# Patient Record
Sex: Female | Born: 1965 | Race: White | Hispanic: No | Marital: Married | Smoking: Never smoker
Health system: Southern US, Community
[De-identification: ages and names within clinical notes are randomized; demographics above are authoritative.]

## PROBLEM LIST (undated history)

## (undated) HISTORY — PX: HERNIA REPAIR: SHX51

---

## 1999-07-20 ENCOUNTER — Emergency Department (HOSPITAL_COMMUNITY): Admission: EM | Admit: 1999-07-20 | Discharge: 1999-07-20 | Payer: Self-pay | Admitting: Emergency Medicine

## 1999-07-20 ENCOUNTER — Encounter: Payer: Self-pay | Admitting: Emergency Medicine

## 2000-02-13 ENCOUNTER — Emergency Department (HOSPITAL_COMMUNITY): Admission: EM | Admit: 2000-02-13 | Discharge: 2000-02-13 | Payer: Self-pay | Admitting: *Deleted

## 2000-02-13 ENCOUNTER — Encounter: Payer: Self-pay | Admitting: *Deleted

## 2003-02-25 ENCOUNTER — Emergency Department (HOSPITAL_COMMUNITY): Admission: EM | Admit: 2003-02-25 | Discharge: 2003-02-26 | Payer: Self-pay | Admitting: Emergency Medicine

## 2003-02-26 ENCOUNTER — Encounter: Payer: Self-pay | Admitting: Emergency Medicine

## 2005-05-20 ENCOUNTER — Emergency Department (HOSPITAL_COMMUNITY): Admission: EM | Admit: 2005-05-20 | Discharge: 2005-05-20 | Payer: Self-pay | Admitting: Emergency Medicine

## 2005-06-14 ENCOUNTER — Ambulatory Visit (HOSPITAL_COMMUNITY): Admission: RE | Admit: 2005-06-14 | Discharge: 2005-06-14 | Payer: Self-pay | Admitting: Gastroenterology

## 2005-06-18 ENCOUNTER — Encounter: Admission: RE | Admit: 2005-06-18 | Discharge: 2005-06-18 | Payer: Self-pay | Admitting: Gastroenterology

## 2005-06-29 ENCOUNTER — Encounter: Admission: RE | Admit: 2005-06-29 | Discharge: 2005-06-29 | Payer: Self-pay | Admitting: Gastroenterology

## 2005-08-17 ENCOUNTER — Inpatient Hospital Stay (HOSPITAL_COMMUNITY): Admission: RE | Admit: 2005-08-17 | Discharge: 2005-08-19 | Payer: Self-pay

## 2007-01-25 IMAGING — US US ABDOMEN COMPLETE
1 series · 14 of 25 positions shown · non-contrast
Comparison: None.

CLINICAL DATA: Epigastric abdominal pain.

ABDOMEN ULTRASOUND
TECHNIQUE: Complete abdominal ultrasound examination was performed including
evaluation of the liver, gallbladder, bile ducts, pancreas, kidneys, spleen,
IVC, and abdominal aorta.

[Series 1: unknown · 0.34mm/px · 14 of 66 slices shown]
[im 1/66]
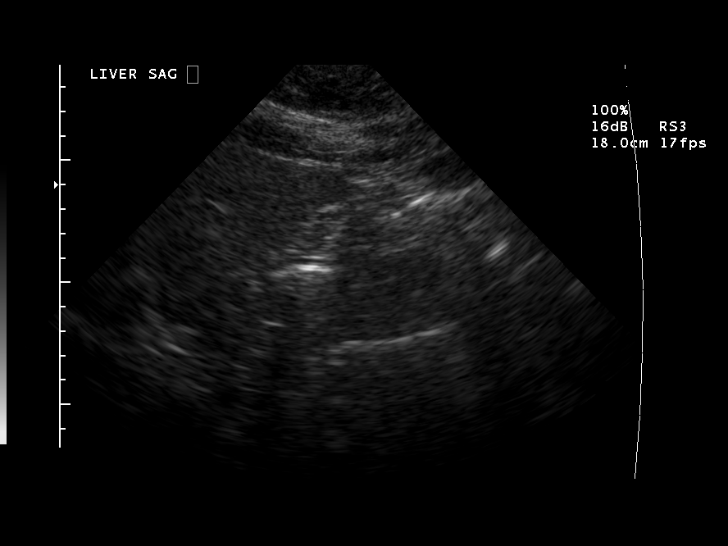
[im 6/66]
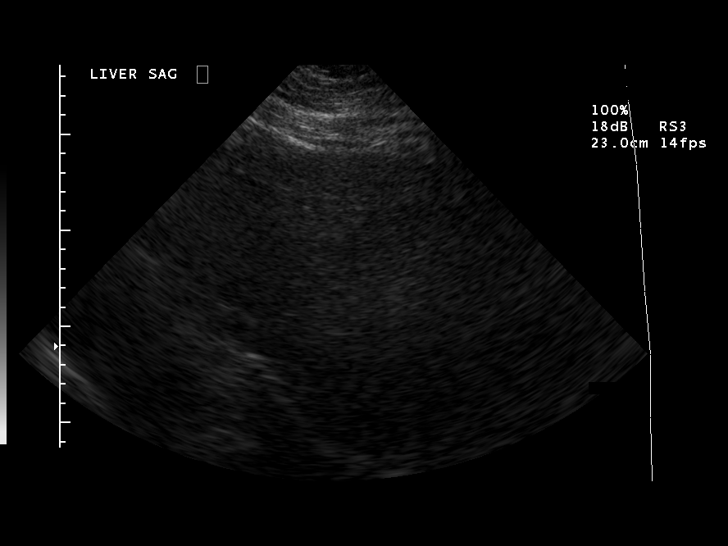
[im 11/66]
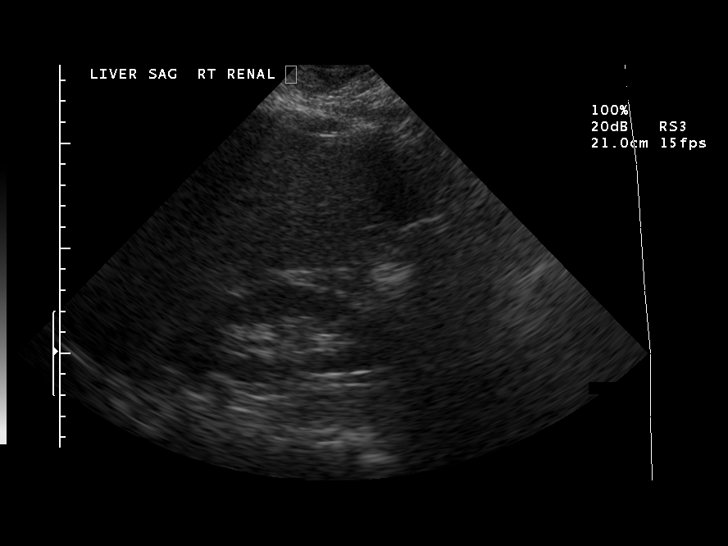
[im 17/66]
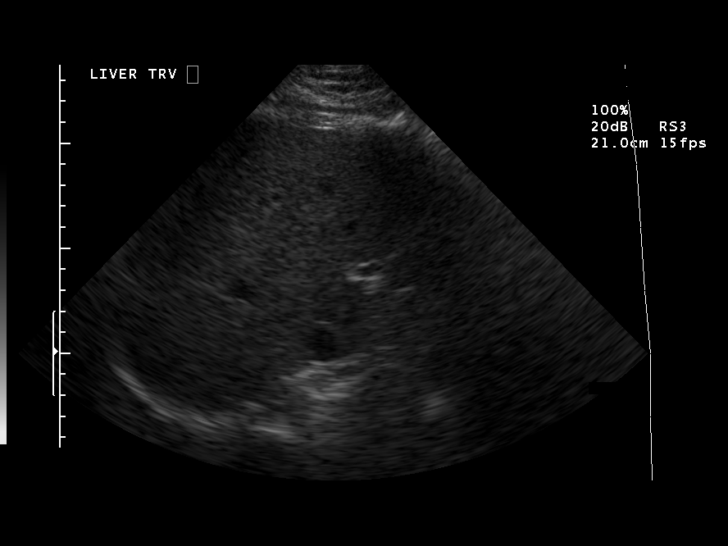
[im 22/66]
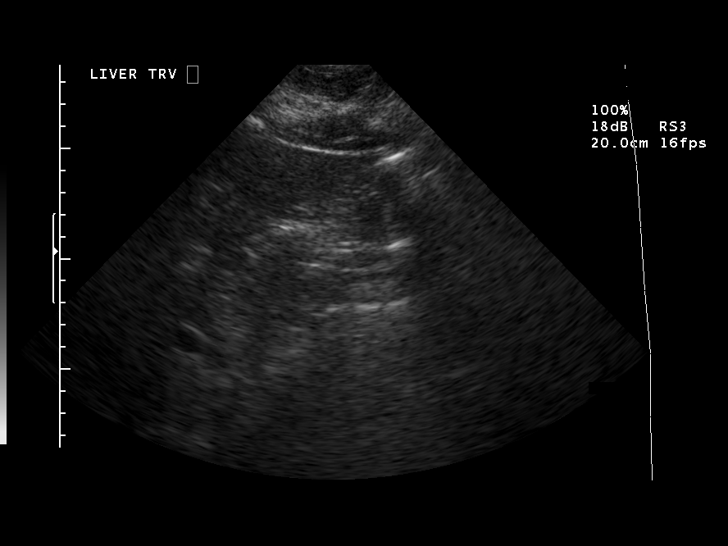
[im 25/66]
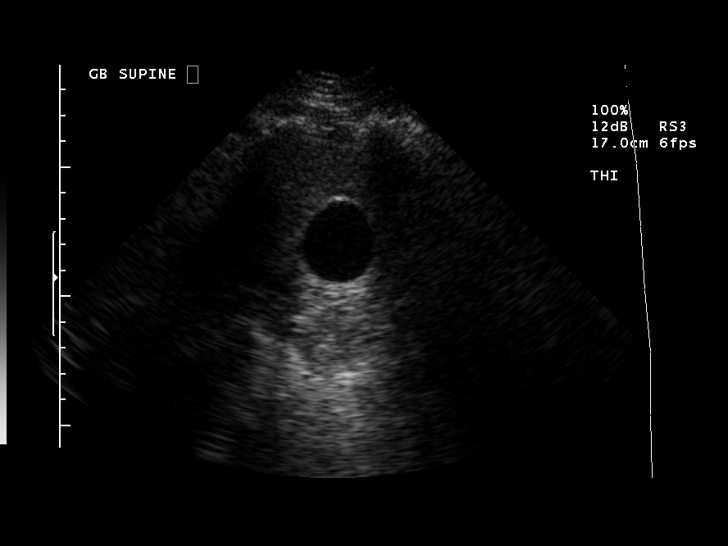
[im 30/66]
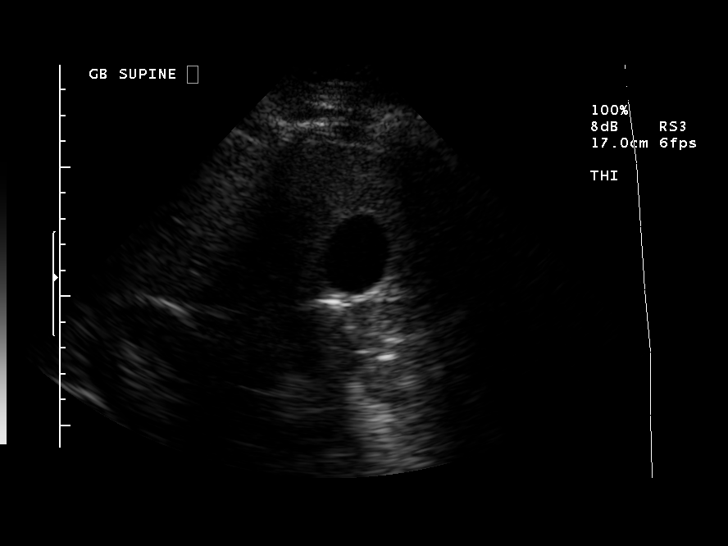
[im 36/66]
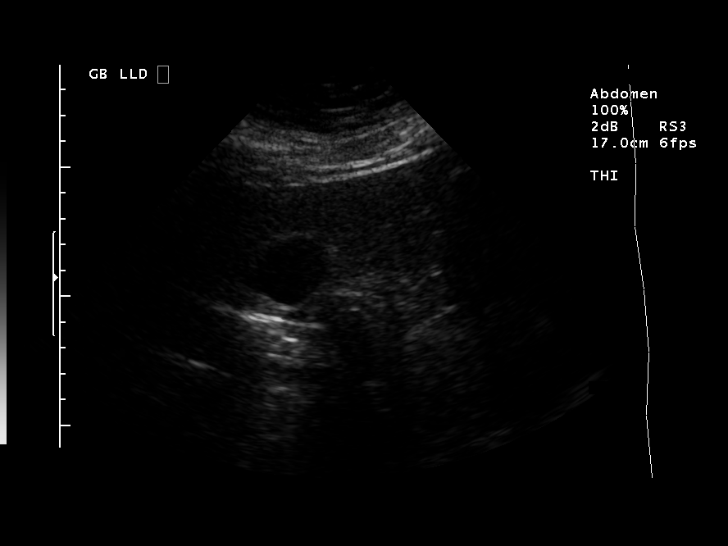
[im 41/66]
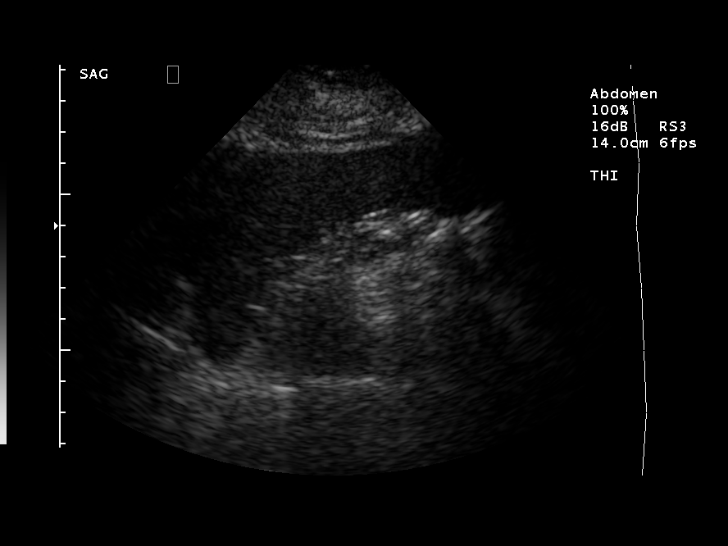
[im 44/66]
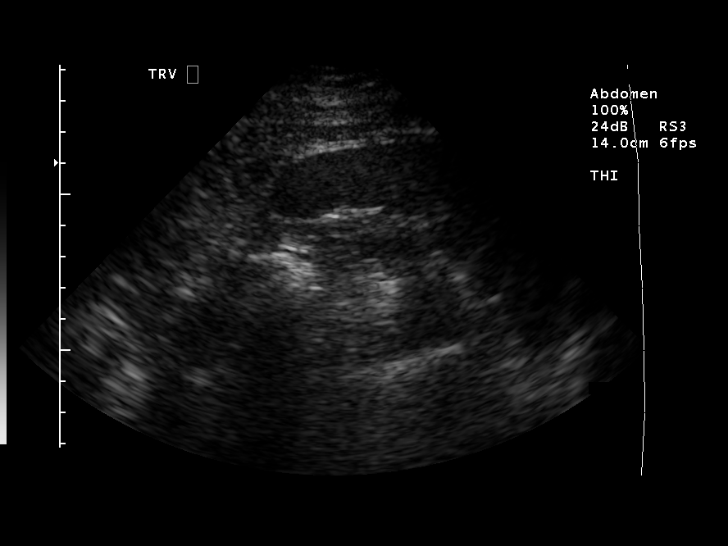
[im 49/66]
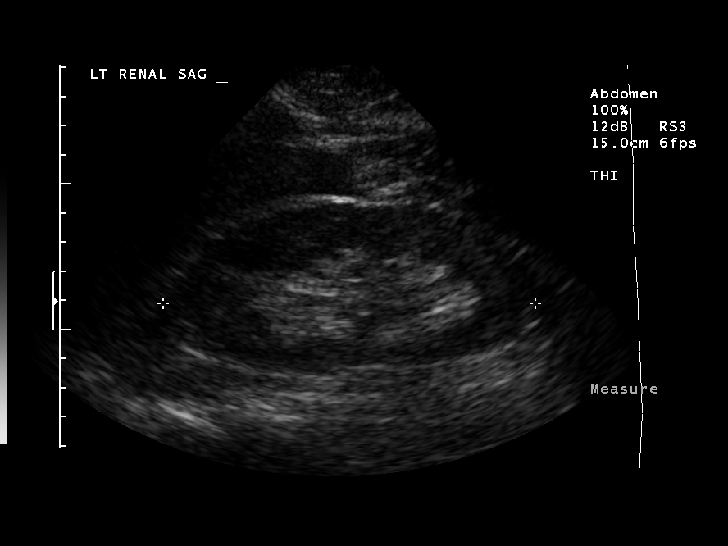
[im 55/66]
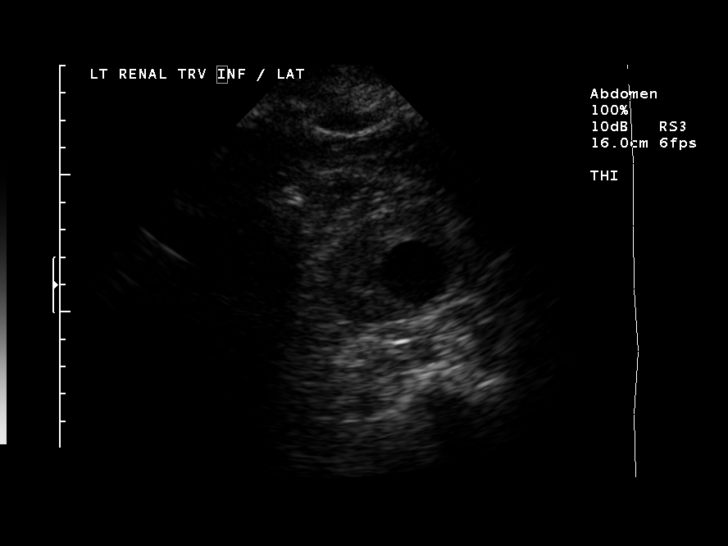
[im 60/66]
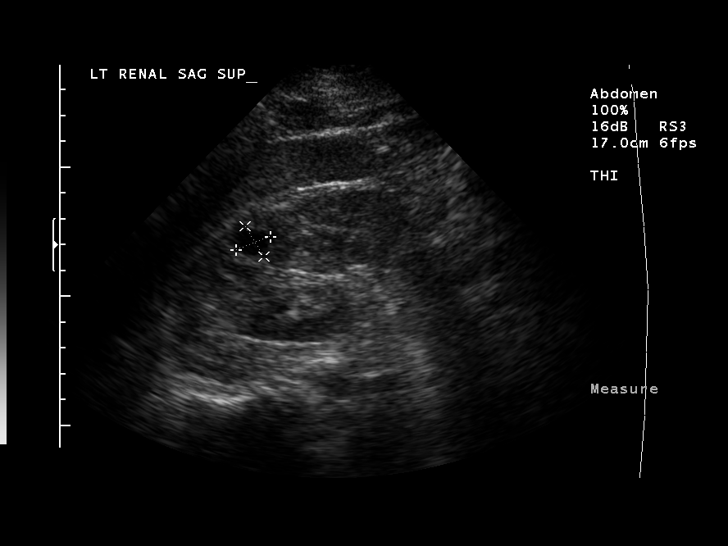
[im 66/66]
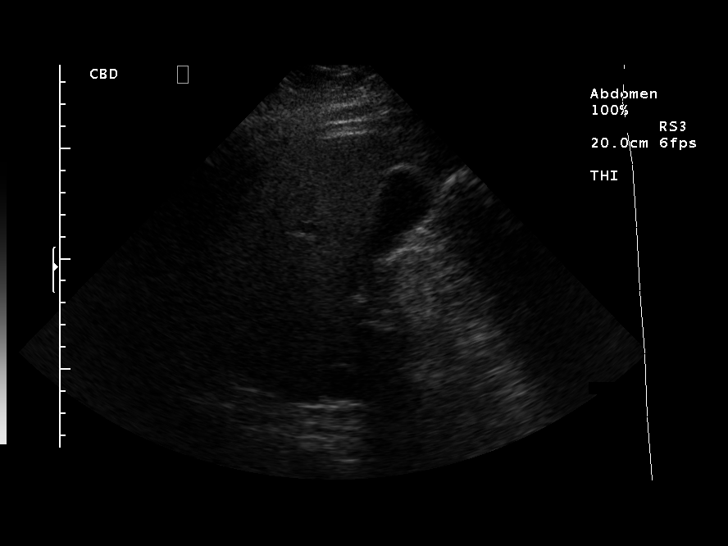

[14 of 25 positions shown; findings below may reference images not displayed]

FINDINGS: The examination is limited by the large size of the patient. Mildly
echogenic liver. Poorly visualized pancreas and abdominal aorta due to overlying
bowel gas and the large size of the patient. Normal-appearing spleen, right
kidney, gallbladder and proximal inferior vena cava. No gallstones or free
peritoneal fluid. The common duct was not visualized due to overlying bowel gas.
No intrahepatic ductal dilatation seen. Multiple simple appearing left renal
cysts. The largest is in the lower pole and measures 2.8 cm in maximum diameter.

IMPRESSION

1. Limited examination due to the large size of the patient and overlying bowel
gas. The common duct and abdominal aorta were not visualized and the pancreas
was poorly visualized.

2. Mild diffuse fatty infiltration of the liver.

3. Multiple simple left renal cysts.

## 2007-05-19 ENCOUNTER — Emergency Department (HOSPITAL_COMMUNITY): Admission: EM | Admit: 2007-05-19 | Discharge: 2007-05-19 | Payer: Self-pay | Admitting: *Deleted

## 2009-03-12 ENCOUNTER — Inpatient Hospital Stay (HOSPITAL_COMMUNITY): Admission: EM | Admit: 2009-03-12 | Discharge: 2009-03-19 | Payer: Self-pay | Admitting: Emergency Medicine

## 2011-01-10 ENCOUNTER — Encounter: Payer: Self-pay | Admitting: Gastroenterology

## 2011-04-01 LAB — GLUCOSE, CAPILLARY
Glucose-Capillary: 130 mg/dL — ABNORMAL HIGH (ref 70–99)
Glucose-Capillary: 140 mg/dL — ABNORMAL HIGH (ref 70–99)
Glucose-Capillary: 142 mg/dL — ABNORMAL HIGH (ref 70–99)
Glucose-Capillary: 156 mg/dL — ABNORMAL HIGH (ref 70–99)
Glucose-Capillary: 157 mg/dL — ABNORMAL HIGH (ref 70–99)
Glucose-Capillary: 177 mg/dL — ABNORMAL HIGH (ref 70–99)
Glucose-Capillary: 206 mg/dL — ABNORMAL HIGH (ref 70–99)
Glucose-Capillary: 216 mg/dL — ABNORMAL HIGH (ref 70–99)
Glucose-Capillary: 231 mg/dL — ABNORMAL HIGH (ref 70–99)
Glucose-Capillary: 234 mg/dL — ABNORMAL HIGH (ref 70–99)
Glucose-Capillary: 258 mg/dL — ABNORMAL HIGH (ref 70–99)
Glucose-Capillary: 273 mg/dL — ABNORMAL HIGH (ref 70–99)

## 2011-04-01 LAB — URINALYSIS, ROUTINE W REFLEX MICROSCOPIC
Glucose, UA: >1000 mg/dL — AB
Nitrite: NEGATIVE
Protein, ur: 100 mg/dL — AB
Specific Gravity, Urine: 1.02 (ref 1.005–1.030)

## 2011-04-01 LAB — CBC
HCT: 34.4 % — ABNORMAL LOW (ref 36.0–46.0)
HCT: 41.3 % (ref 36.0–46.0)
HCT: 43.1 % (ref 36.0–46.0)
Hemoglobin: 11.7 g/dL — ABNORMAL LOW (ref 12.0–15.0)
Hemoglobin: 12 g/dL (ref 12.0–15.0)
Hemoglobin: 14.3 g/dL (ref 12.0–15.0)
MCHC: 34.4 g/dL (ref 30.0–36.0)
MCHC: 34.7 g/dL (ref 30.0–36.0)
MCHC: 34.8 g/dL (ref 30.0–36.0)
MCHC: 35 g/dL (ref 30.0–36.0)
MCV: 92.9 fL (ref 78.0–100.0)
MCV: 93.3 fL (ref 78.0–100.0)
MCV: 93.5 fL (ref 78.0–100.0)
Platelets: 229 10*3/uL (ref 150–400)
Platelets: 230 10*3/uL (ref 150–400)
RBC: 3.66 MIL/uL — ABNORMAL LOW (ref 3.87–5.11)
RBC: 3.68 MIL/uL — ABNORMAL LOW (ref 3.87–5.11)
RDW: 12.6 % (ref 11.5–15.5)
WBC: 9.7 10*3/uL (ref 4.0–10.5)

## 2011-04-01 LAB — BASIC METABOLIC PANEL
CO2: 30 mEq/L (ref 19–32)
CO2: 34 mEq/L — ABNORMAL HIGH (ref 19–32)
Calcium: 8.2 mg/dL — ABNORMAL LOW (ref 8.4–10.5)
Chloride: 100 mEq/L (ref 96–112)
Chloride: 92 mEq/L — ABNORMAL LOW (ref 96–112)
Chloride: 92 mEq/L — ABNORMAL LOW (ref 96–112)
Creatinine, Ser: 0.8 mg/dL (ref 0.4–1.2)
Creatinine, Ser: 0.86 mg/dL (ref 0.4–1.2)
GFR calc Af Amer: >60 mL/min (ref >60)
GFR calc Af Amer: >60 mL/min (ref >60)
Glucose, Bld: 145 mg/dL — ABNORMAL HIGH (ref 70–99)
Potassium: 3.1 mEq/L — ABNORMAL LOW (ref 3.5–5.1)
Potassium: 3.4 mEq/L — ABNORMAL LOW (ref 3.5–5.1)
Potassium: 3.5 mEq/L (ref 3.5–5.1)
Sodium: 131 mEq/L — ABNORMAL LOW (ref 135–145)
Sodium: 132 mEq/L — ABNORMAL LOW (ref 135–145)
Sodium: 139 mEq/L (ref 135–145)

## 2011-04-01 LAB — ANAEROBIC CULTURE

## 2011-04-01 LAB — DIFFERENTIAL
Eosinophils Absolute: 0 10*3/uL (ref 0.0–0.7)
Lymphocytes Relative: 14 % (ref 12–46)
Monocytes Absolute: 1 10*3/uL (ref 0.1–1.0)
Monocytes Relative: 7 % (ref 3–12)
Neutro Abs: 11.1 10*3/uL — ABNORMAL HIGH (ref 1.7–7.7)
Neutrophils Relative %: 79 % — ABNORMAL HIGH (ref 43–77)

## 2011-04-01 LAB — POCT I-STAT 4, (NA,K, GLUC, HGB,HCT)
HCT: 47 % — ABNORMAL HIGH (ref 36.0–46.0)
Potassium: 2.7 mEq/L — CL (ref 3.5–5.1)
Sodium: 127 mEq/L — ABNORMAL LOW (ref 135–145)

## 2011-04-01 LAB — HEPATIC FUNCTION PANEL
Albumin: 2.6 g/dL — ABNORMAL LOW (ref 3.5–5.2)
Total Bilirubin: 0.4 mg/dL (ref 0.3–1.2)
Total Protein: 6.5 g/dL (ref 6.0–8.3)

## 2011-04-01 LAB — POCT PREGNANCY, URINE: Preg Test, Ur: NEGATIVE

## 2011-04-01 LAB — URINE MICROSCOPIC-ADD ON

## 2011-04-01 LAB — LIPID PANEL
Cholesterol: 207 mg/dL — ABNORMAL HIGH (ref 0–200)
LDL Cholesterol: 149 mg/dL — ABNORMAL HIGH (ref 0–99)

## 2011-04-01 LAB — HEMOGLOBIN A1C: Mean Plasma Glucose: 260 mg/dL

## 2011-04-01 LAB — WOUND CULTURE: Gram Stain: NONE SEEN

## 2011-05-04 NOTE — Op Note (Signed)
NAME:  Miranda Leonard, Miranda Leonard                  ACCOUNT NO.:  [id number]   MEDICAL RECORD NO.:  [id number]          PATIENT TYPE:  OBV   LOCATION:  5021                         FACILITY:  [facility]   PHYSICIAN:  Wilmon Arms. Corliss Skains, M.D. DATE OF BIRTH:  03/18/1966   DATE OF PROCEDURE:  03/13/2009  DATE OF DISCHARGE:                               OPERATIVE REPORT   PREOPERATIVE DIAGNOSIS:  Left groin subcutaneous abscess.   POSTOPERATIVE DIAGNOSIS:  Extensive left groin subcutaneous abscess.   PROCEDURE PERFORMED:  Incision, drainage, and irrigation of left groin  abscess, complex extensive, measuring 15 x 10 cm.   SURGEON:  Wilmon Arms. Tsuei, MD   ANESTHESIA:  General.   INDICATIONS:  The patient is a morbidly obese 45 year old female who  presents with a 5-day history of worsening pain in her left groin.  She  presents to the emergency apartment after she developed nausea,  vomiting, and fever.  A CT scan showed a large complex abscess in her  left groin and subcutaneous tissues extending up on the abdominal wall  and down into her groin.  She had elevated white count and was febrile.  We were consulted to see the patient.  We decided to immediately  admitted to the hospital and bring her to the operating room for  drainage of this area.   DESCRIPTION OF PROCEDURE:  The patient was brought to the operating room  and placed in the supine position on operating room table.  After an  adequate level of general anesthesia was obtained, a Foley catheter was  placed under sterile technique.  Her legs were placed in Yellofin  stirrups in a lithotomy position.  The patient had fairly poor hygiene  in her groin due to her body habitus.  There was a fairly foul-smelling  odor even before beginning the procedure.  Her entire lower abdominal  wall, groins, and thighs were prepped with Betadine and draped in a  sterile fashion.  A time-out was taken to assure the proper patient and  proper procedure.  I made a  stab incision directly over the area of most  fluctuance.  We encountered a large pocket of a very foul-smelling pus.  This was cultured.  We suctioned as much as possible and then extended  my incision.  I explored the wound with my finger and broke the  loculations.  There was pocket heading towards the labia.  There was  also extensive pockets heading up laterally and towards the lower  abdominal wall out at the anterior-superior iliac spine.  We opened this  widely and broke up several pockets of pus.  The wound was then  thoroughly irrigated with pulse lavage using couple liters of normal  saline.  Once we were satisfied that we had opened up all the pockets  and the wound appeared more clean, we tried to obtain some hemostasis  with cautery.  There was generalized ooze in this areas and  the wound was packed tightly with Kerlix soaked in normal saline.  An  ABD dressing was applied and secured with tape.  The  patient was then  extubated and brought to the recovery room in stable condition.  All  sponge, instrument, and needle counts were correct.      Wilmon Arms. Tsuei, M.D.  Electronically Signed     MKT/MEDQ  D:  03/13/2009  T:  03/13/2009  Job:  [id number]

## 2011-05-04 NOTE — Consult Note (Signed)
NAME:  Miranda Leonard, Miranda Leonard                  ACCOUNT NO.:  [id number]   MEDICAL RECORD NO.:  [id number]          PATIENT TYPE:  INP   LOCATION:  5148                         FACILITY:  [facility]   PHYSICIAN:  Hollice Espy, M.D.DATE OF BIRTH:  1966-08-25   DATE OF CONSULTATION:  03/17/2009  DATE OF DISCHARGE:                                 CONSULTATION   REASON FOR CONSULTATION:  New diagnosis of diabetes mellitus.   HISTORY OF PRESENT ILLNESS:  The patient is a 45 year old white female  with past medical history of obesity who was admitted on March 12, 2009,  for findings of a left groin subcutaneous abscess.  She was admitted to  [facility] and underwent debridement on March 13, 2009.  Her hospitalization was also complicated by a UTI with yeast of which  she was put on Diflucan for.  She was also noted to have some elevated  blood sugar.  A hemoglobin A1c was checked and was found to be 10.7.  This was a new diagnosis for and the patient was started on metformin  sliding scale and [facility] were consulted for further  evaluation.  When I saw the patient, she was doing okay.  She complained  of some mild soreness in the last groin, but denied any headaches,  vision changes, dysphagia, chest pain, palpitations, shortness of  breath, wheeze, cough, abdominal pain, hematuria, dysuria, constipation,  diarrhea, focal extremity numbness, weakness or pain.   REVIEW OF SYSTEMS:  Otherwise negative.   PAST MEDICAL HISTORY:  Obesity.   MEDICATIONS PRIOR TO HOSPITALIZATION:  None.   ALLERGIES:  NONE.   SOCIAL HISTORY:  She denies any tobacco, alcohol or drug use.   FAMILY HISTORY:  Negative for diabetes.   PHYSICAL EXAMINATION:  VITAL SIGNS:  Most recent set; temperature 97,  blood pressure 114/77, respirations 14, pulse 76.  O2 saturation 95% on  room air.  GENERAL:  She is alert and oriented x3 in no apparent distress.  HEENT:  Normocephalic, atraumatic.  Her mucous  membranes are slightly  dry.  NECK:  She has no carotid bruits.  HEART:  Regular rate and rhythm.  S1-S2.  Questionable soft systolic  ejection murmur.  LUNGS:  Clear to auscultation bilaterally, but decreased breath sounds  throughout secondary to body habitus.  ABDOMEN:  Soft, obese and nontender.  Positive bowel sounds.  EXTREMITIES:  Show no clubbing or cyanosis.  She has trace pitting  edema.  She has some tenderness at her incision site with some minimal  drainage noted.   LABORATORY DATA:  Sodium 139, potassium 3.9, chloride 100, bicarb 34,  BUN 4, creatinine 0.74, glucose 145, white count 8.3.  H and H 11.7 and  34.  Platelet count 295.  Her A1c is at 10.7 with average blood sugar  equaling 260.   ASSESSMENT/PLAN:  1. Diabetes mellitus type 2, new onset.  The patient has been on      metformin 500, would increased to 500 b.i.d.  For the most part,      the patient should not get hypoglycemic with  this.  She should be      responding well.  Would continue her on now moderate sliding scale      and she can go home on just metformin.  Also refer to diabetes      outpatient classes.  The patient does not have a primary care      physician and there was a new endocrinologist in two, Dr. Debara Pickett, who is taking new patients and will be able to setup an      appointment for her prior to discharge.  The patient herself seems      encouraged about trying to work with this, and I cautioned her on      the one hand about taking this disease seriously, but at the same      time not being overwhelmed and being frustrated with not being able      to get this right away.  We will put on metformin 500 p.o. b.i.d.      I also noticed in review of her urinalysis on admission she had      some proteinuria, although some of this was probably from      dehydration.  She probably would benefit from a low-dose ACE      inhibitor for kidney preservation long-term secondary to her       diabetes.  However, on review of her blood pressure in the last 24      hours at times she had dipped to as low in the 80s, so would hold      off for now and watching her blood pressure further.  If she still      is borderline hypotensive before discharge, would hold on the      lisinopril and Dr. Sharl Ma can follow this as an outpatient.  I would      also give the patient a Glucometer, test strips and lancets prior      to discharge.  In addition, I have advised the patient and will put      on her discharge instructions that she should make an appointment      with ophthalmology where she will need annual eye visits.  2. Morbid obesity.  The patient again has had no previous primary care      physician visits and given the concern about her obesity and      diabetes, she may fall may some metabolic syndrome.  Will plan to      go ahead and check a fasting lipid profile prior to discharge which      may need medical intervention as well.  3. Urinary tract infection with yeast.  She is already on medications.  4. Groin abscess as per surgery.  Antibiotics.  The patient's sugar      control should improve following completion of infection treatment.      Hollice Espy, M.D.  Electronically Signed     SKK/MEDQ  D:  03/17/2009  T:  03/17/2009  Job:  [id number]   cc:   Talmage Coin, M.D.  Wilmon Arms. Tsuei, M.D.

## 2011-05-04 NOTE — Discharge Summary (Signed)
NAME:  Miranda Leonard, Miranda Leonard                  ACCOUNT NO.:  [id number]   MEDICAL RECORD NO.:  [id number]          PATIENT TYPE:  INP   LOCATION:  5148                         FACILITY:  [facility]   PHYSICIAN:  Lennie Muckle, MD      DATE OF BIRTH:  05-06-1966   DATE OF ADMISSION:  03/13/2009  DATE OF DISCHARGE:  03/19/2009                               DISCHARGE SUMMARY   ADMITTING PHYSICIAN:  Wilmon Arms. Corliss Skains, MD   CONSULTANTS:  Hollice Espy, MD, with [facility].   CHIEF COMPLAINT/REASON FOR ADMISSION:  [name] is a morbidly obese  female patient who presented with a 5-day history of worsening pain in  left groin, began developing fevers and nausea and vomiting.  In the ER,  her temperature was 101, pulse was 124, and BP 134/84.  She had a white  count of 14,100.  CT was done that showed a large subcutaneous abscess  on the left groin extending up to the abdominal wall and down to the  thigh.  On clinical exam, the patient was found to have a very large  tender, erythematous abscess in the left groin.   ADMISSION DIAGNOSES:  1. Leukocytosis and fever secondary to large colon abscess.  2. Hyperglycemia, suspected undiagnosed diabetes.   HOSPITAL COURSE:  The patient was immediately taken from the ER to the  OR where she underwent an incision, drainage, and irrigation of the left  colon abscess.  This was a very complex extensive abscess measuring 15 x  10 cm.  The patient was sent back to the floor with b.i.d. packing and  was placed empirically on clindamycin and ciprofloxacin.   In the postoperative period, the patient was having significant  difficulty with pain in the operative site and large volume Vaseline  dressing.  Hydrotherapy was initiated as well.  Subsequent urine  cultures did grow out yeast.  Wound culture grew out pansensitive  Streptococcus D bovis.  Hemoglobin A1c was returned back at 10.7, so  diabetes education was initiated.  The patient had been  placed on  sliding scale insulin.  Lantus was added during the hospitalization.  Medicine consult was obtained.  The patient was transitioned over to  metformin.  Screening was obtained regarding comorbidities associated  with diabetes and appropriate medications initiated.   From a wound standpoint, the patient's wound care was increased to  t.i.d. along with the hydrotherapy by the date of discharge.  Wound was  clean with a red granular base.  Wound edema and erythema had resolved.  The patient's pain was controlled with p.o. Percocet.  She was no longer  requiring IV Dilaudid.  She had received 6 days of IV clindamycin and  Cipro, so these were discontinued at discharge.  She is also  discontinued from the Diflucan.  Her white count was last checked on  March 17, 2009/postop day #4 was 8300, hemoglobin 11.7, and platelets  295,000.  Sodium was 139, potassium 3.9, glucose 145, BUN 4, and  creatinine 0.74.  Medicine also checked a fasting lipid panel on this  patient.  She had a very mild transaminitis with an AST of 44, ALT 30,  alkaline phosphatase 126, and total bilirubin 0.4.  Plans were to start  the patient on pravastatin at discharge.  Medicine has not seen the  patient.  They will include this in the discharge information for the  patient and I discussed this with the patient as well.  In addition, the  patient has seen multiple diabetes videos.  She has spoken to a  nutrition and diabetes coordinator as well as nurses and has received  instruction about diabetes and how to check her blood sugars and signs  and symptoms of hyper and hypoglycemia.   On the date of discharge, the patient was afebrile, vital signs were  stable.  She was having bowel movements.  She was tolerating a regular  diet.  Her glucose range was between 130 and 163 for the past 24 hours  and her wound was unchanged with a clean base and we are planning on  b.i.d. wound care after discharge with [facility] following.   FINAL DISCHARGE DIAGNOSES:  1. Complex extensive left groin abscess.  Cultures positive for      Streptococcus D bovis.  2. Status post extensive incision and drainage of left groin abscess      in the OR by Dr. Corliss Skains.  3. New onset diabetes.  4. Dyslipidemia.  Total cholesterol 207, LDL 149, HDL 19, and      triglycerides 193.   DISCHARGE MEDICATIONS:  The patient was not taking any medication prior  to admission.  These are the following new medications:  1. Metformin 500 mg b.i.d. before meals.  2. Ibuprofen 600 mg t.i.d. as needed for pain.  3. Percocet 5/325 one to two tabs every 4 hours as needed for pain.      In addition, pending Internal Medicine evaluation as the patient      may start low-dose pravastatin today.   Return to work in 3 weeks.  Note has been given.  This can be  reevaluated at followup appointment.   DIET:  Diabetic, carb-modified diet as instructed.   WOUND CARE:  Normal saline packing in the left groin wound twice daily,  home health RN to assist.   ACTIVITY:  Increase activity slowly.  May walk up steps.  May shower for  comfort reasons only.  No lifting greater than 15 pounds for 2 weeks and  no driving for 1 week.   ADDITIONAL INSTRUCTIONS:  1. You are to check your blood sugar before meals and at bedtime.      Record these values and the times checked and bring to MD visits.  2. [facility] will call you      regarding an appointment time to follow up with them.   ADDITIONAL FOLLOWUP APPOINTMENTS:  1. Talmage Coin, MD, endocrinologist, telephone number [phone], you      have an appointment on March 24, 2009, at      3:30 p.m.  2. Surgical followup with the doc of the week clinic at [facility], [phone].  This is for March 25, 2009, at 3 p.m.,      arrive at 2:45 p.m. to complete any necessary paperwork.      Allison L. Kennith Center, MD   Electronically Signed    ALE/MEDQ  D:  03/19/2009  T:  03/19/2009  Job:  [id number]   cc:   Wilmon Arms. Tsuei, M.D.  Talmage Coin, M.D.

## 2011-05-07 NOTE — Op Note (Signed)
NAME:  Miranda Leonard, Miranda Leonard                  ACCOUNT NO.:  [id number]   MEDICAL RECORD NO.:  [id number]          PATIENT TYPE:  INP   LOCATION:  0005                         FACILITY[facility]  [facility]   PHYSICIAN:  Lorre Munroe., M.D.DATE OF BIRTH:  [birth year]   DATE OF PROCEDURE:  08/17/2005  DATE OF DISCHARGE:                                 OPERATIVE REPORT   PREOPERATIVE DIAGNOSIS:  Paraesophageal hiatal hernia.   POSTOPERATIVE DIAGNOSIS:  Paraesophageal hiatal hernia.   OPERATION:  Repair of hiatal hernia.   SURGEON:  Lebron Conners, M.D.   ASSISTANT:  Sharlet Salina T. Hoxworth, M.D.   ANESTHESIA:  General and local.   DESCRIPTION OF PROCEDURE:  After the patient was monitored and anesthetized  and had routine preparation and draping of the abdomen, I anesthetized a  spot about a third of the way between the xiphoid and umbilicus and made a  vertical incision and incised the fascia longitudinally, entered the  peritoneum bluntly and placed a #0 Vicryl pursestring suture in the fascia  and inflated the abdomen with CO2. I then used the laparoscope to determine  that there were no abnormalities visible within the abdomen except for a  large hiatal hernia. I then placed a 5 mm epigastric port and through that  put in a fixed liver retractor held by a post on the bed retracting the left  lobe of liver nicely. I put in a 5 mm and a 10 mm left mid abdominal port  and two 10 mm right upper quadrant ports. With the patient in steep reverse  Trendelenburg position, I pulled the contents of the hernia sac out and  found that there was a good bit of omentum and a very large amount of  stomach up in the hernia sac in the mediastinum and it all reduced nicely.  It appeared that the GE junction was probably at about the level of the  diaphragm. Working first anteriorly and then in locations as it became  visible, I pulled down on the tissues around the rim of the hernia and  incised the sac in that  location. I pulled down on the anterior part of the  sac and pulled out the fat and peritoneum that were up in the hernia sac. I  then dissected down the crura of the diaphragm on each side finding that the  right crus was very distinct and thick and easy to dissect out. There was  some defect of the hernia to the right side of the crus although most of it  was to the left side of the esophagus. After demonstrating most of the  crura, I had the anesthesiologist pass a lighted bougie down the esophagus  and then dissected retroesophageal and passed a Penrose drain around the  esophagus so it could be retracted left and right and facilitate the  completion of the crural dissection. On the left side the fundus of the  stomach was tightly adherent to this spleen and I had some difficulty  demonstrating the more posterior part of the left crus.  Therefore I  dissected into  the lesser sac by incising the anterior leaf of the  gastrocolic omentum and using the harmonic scalpel dissected up so that the  fundus of the stomach came away from the spleen and then I could see the  more posterior attachments and was able to dissect up the left crus so it  was quite free. I pulled the retroesophageal fat pad down out of the  mediastinum so that we could get a nice tight crural closure. After I felt  the anatomy was demonstrated well  enough, I used the Endo stitch sutures  with pledgets on each side to close the crura so that it came out snugly but  not tightly around the esophagus using about six stitches. We discussed the  use of a patch of some type prosthetic material but Dr. Johna Sheriff and I both  felt that we had quite secure closure and that it was not necessary. We also  felt that a fundal plication was unnecessary because the patient had no  reflux symptoms but rather symptoms of incarceration and vomiting and pain  after eating. I very carefully inspected the area for bleeding and found  that  there was none. I removed the Penrose drain. I was well satisfied with  the repair. Sponge, needle and instrument counts were correct. I removed  most of the ports under direct vision and then allowed the CO2 to escape and  removed the Hassan cannula and tied the pursestring suture taking care not  to trap any viscera. I closed all skin incisions with intracuticular 4-0  Vicryl and Steri-Strips. She tolerated the operation well.      Lorre Munroe., M.D.  Electronically Signed     WB/MEDQ  D:  08/17/2005  T:  08/17/2005  Job:  [id number]   cc:   Graylin Shiver, M.D.  [address]  [address]  [place], [place] [address]  Fax: [phone]   Jari Pigg, [address] [place].

## 2011-05-07 NOTE — Op Note (Signed)
NAME:  Miranda Leonard, Miranda Leonard                  ACCOUNT NO.:  [id number]   MEDICAL RECORD NO.:  [id number]          PATIENT TYPE:  AMB   LOCATION:  ENDO                         [facility]AC[facility]   PHYSICIAN:  Graylin Shiver, M.D.   DATE OF BIRTH:  Apr 06, 1966   DATE OF PROCEDURE:  06/14/2005  DATE OF DISCHARGE:                                 OPERATIVE REPORT   PROCEDURE:  Upper gastrointestinal endoscopy.   INDICATIONS FOR PROCEDURE:  A 45 year old female with epigastric pain after  eating. The pain starts in the epigastrium and then works its way to the  left side of her chest. Nexium was tried but did not help. She will  occasionally vomit. Upper endoscopy is being done to further evaluate.   Informed consent was obtained after explanation of the risks of bleeding,  infection and perforation.   PREMEDICATION:  Fentanyl 60 mcg IV, Versed 7 mg IV.   DESCRIPTION OF PROCEDURE:  With the patient in the left lateral decubitus  position, the Olympus gastroscope was inserted into the oropharynx and  passed into the esophagus. It was advanced down the esophagus. The  esophageal lumen and mucosa looked normal. The esophagogastric junction was  at 35 cm from the incisor teeth. The scope was then advanced into the  stomach into a hiatal hernia. This pouch of the hiatal hernia was inspected  and looked normal. I had to angulate the scope acutely in order to then find  the lumen to advance the scope into the rest of the stomach. I was almost in  a retroflexed position. Advancing the scope then into the region of the body  of the stomach revealed that I could see a portion of the body, but then it  would deflect away from me.  My scope tip was still almost in a retroflex  position. I think that this patient may have a paraesophageal hernia causing  her symptoms. The scope was removed. She tolerated the procedure well.   IMPRESSION:  1.  Hiatal hernia.  2.  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   PLAN:  I am going to obtain an upper GI to further investigate this matter.       SFG/MEDQ  D:  06/14/2005  T:  06/14/2005  Job:  [id number]   cc:   Wille Celeste, FNP  [facility]

## 2012-08-08 ENCOUNTER — Encounter (HOSPITAL_COMMUNITY): Payer: Self-pay | Admitting: Emergency Medicine

## 2012-08-08 ENCOUNTER — Emergency Department (HOSPITAL_COMMUNITY)
Admission: EM | Admit: 2012-08-08 | Discharge: 2012-08-08 | Disposition: A | Discharge status: Home / Self Care | Payer: Self-pay | Attending: Emergency Medicine | Admitting: Emergency Medicine

## 2012-08-08 DIAGNOSIS — E119 Type 2 diabetes mellitus without complications: Secondary | ICD-10-CM | POA: Insufficient documentation

## 2012-08-08 DIAGNOSIS — B86 Scabies: Secondary | ICD-10-CM | POA: Insufficient documentation

## 2012-08-08 DIAGNOSIS — Z880 Allergy status to penicillin: Secondary | ICD-10-CM | POA: Insufficient documentation

## 2012-08-08 DIAGNOSIS — R21 Rash and other nonspecific skin eruption: Secondary | ICD-10-CM | POA: Insufficient documentation

## 2012-08-08 MED ORDER — PERMETHRIN 5 % EX CREA: TOPICAL_CREAM | CUTANEOUS | Status: AC

## 2012-08-08 NOTE — ED Notes (Signed)
MD at bedside. 

## 2012-08-08 NOTE — ED Notes (Signed)
Pt c/o generalized rash or bites that pt thinks could be scabies; pt c/o abscess in right inner thigh

## 2012-08-08 NOTE — ED Provider Notes (Signed)
History  This chart was scribed for Miranda Roots, MD by Miranda Leonard. This patient was seen in room TR07C/TR07C and the patient's care was started at 1427.   CSN: [id number]  Arrival date & time 08/08/12  1427   None     Chief Complaint  Patient presents with  . Rash  . Abscess   Patient is a 46 y.o. female presenting with abscess. The history is provided by the patient. No language interpreter was used.  Abscess  Pertinent negatives include no fever and no vomiting.   Miranda Leonard is a 46 y.o. female who presents to the Emergency Department complaining of generalized itchy rash over her body present for about one month. She states the rash is also itchy between her fingers and denies using any new soaps or detergents. She also complains of an abscess of her right inner thigh and attempted draining it yesterday with no discharge. She denies any sore throat. She states similar symptoms about a year ago where she had scabies. She denies any other injuries/illnesses at this time.  Past Medical History  Diagnosis Date  . Diabetes mellitus     History reviewed. No pertinent past surgical history.  History reviewed. No pertinent family history.  History  Substance Use Topics  . Smoking status: Never Smoker   . Smokeless tobacco: Not on file  . Alcohol Use: Yes     occasional    OB History    Grav Para Term Preterm Abortions TAB SAB Ect Mult Living                  Review of Systems  Constitutional: Negative for fever and chills.  Respiratory: Negative for shortness of breath.   Gastrointestinal: Negative for nausea and vomiting.  Skin: Positive for rash (Generalized rash over her body, between her fingers. ) and wound (Small wound/abscess of her right inner thigh. ).  Neurological: Negative for weakness.  All other systems reviewed and are negative.    Allergies  Penicillins  Home Medications   Current Outpatient Rx  Name Route Sig Dispense Refill  . ACETAMINOPHEN  500 MG PO TABS Oral Take 1,000 mg by mouth every 6 (six) hours as needed. For pain    . IBUPROFEN 200 MG PO TABS Oral Take 400 mg by mouth every 6 (six) hours as needed. For pain    . PERMETHRIN 5 % EX CREA  Apply to affected area as directed 60 g 0    Triage Vitals: BP 142/101  Pulse 115  Temp 98.5 F (36.9 C) (Oral)  Resp 18  SpO2 97%  Physical Exam  Nursing note and vitals reviewed. Constitutional: She is oriented to person, place, and time. She appears well-developed and well-nourished. No distress.  HENT:  Head: Normocephalic and atraumatic.  Eyes: Conjunctivae are normal.  Neck: Neck supple. No tracheal deviation present.  Cardiovascular: Normal rate.   Pulmonary/Chest: Effort normal. No respiratory distress.  Musculoskeletal: Normal range of motion. She exhibits no edema.  Neurological: She is alert and oriented to person, place, and time.  Skin: Skin is warm and dry.       Sparse rash to trunk and extremities, web space between digits, c/w scabies. No palms or soles. No mm involvement.  5 mm area scabbing inner right thigh. No fluctuance/abscess. No cellulitis.   Psychiatric: She has a normal mood and affect. Her behavior is normal.    ED Course  Procedures (including critical care time) DIAGNOSTIC STUDIES: Oxygen Saturation is  97% on room air, normal by my interpretation.    COORDINATION OF CARE: At 215 PM Discussed treatment plan with patient which includes Elimite topical cream. Patient agrees.   Labs Reviewed - No data to display No results found.   1. Rash   2. Scabies       MDM  I personally performed the services described in this documentation, which was scribed in my presence. The recorded information has been reviewed and considered. Miranda Roots, MD         Miranda Roots, MD 08/08/12 [phone]

## 2013-04-16 ENCOUNTER — Encounter (HOSPITAL_COMMUNITY): Payer: Self-pay | Admitting: *Deleted

## 2013-04-16 ENCOUNTER — Emergency Department (HOSPITAL_COMMUNITY)
Admission: EM | Admit: 2013-04-16 | Discharge: 2013-04-16 | Disposition: A | Discharge status: Home / Self Care | Payer: Self-pay | Attending: Emergency Medicine | Admitting: Emergency Medicine

## 2013-04-16 DIAGNOSIS — E119 Type 2 diabetes mellitus without complications: Secondary | ICD-10-CM | POA: Insufficient documentation

## 2013-04-16 DIAGNOSIS — W268XXA Contact with other sharp object(s), not elsewhere classified, initial encounter: Secondary | ICD-10-CM | POA: Insufficient documentation

## 2013-04-16 DIAGNOSIS — S51809A Unspecified open wound of unspecified forearm, initial encounter: Secondary | ICD-10-CM | POA: Insufficient documentation

## 2013-04-16 DIAGNOSIS — Y92009 Unspecified place in unspecified non-institutional (private) residence as the place of occurrence of the external cause: Secondary | ICD-10-CM | POA: Insufficient documentation

## 2013-04-16 DIAGNOSIS — Y9389 Activity, other specified: Secondary | ICD-10-CM | POA: Insufficient documentation

## 2013-04-16 DIAGNOSIS — S51811A Laceration without foreign body of right forearm, initial encounter: Secondary | ICD-10-CM

## 2013-04-16 MED ORDER — ACETAMINOPHEN 500 MG PO TABS
1000.0000 mg | ORAL_TABLET | Freq: Once | ORAL | Status: AC
  Administered 2013-04-16: 1000 mg via ORAL
  Filled 2013-04-16: qty 2

## 2013-04-16 MED ORDER — IBUPROFEN 800 MG PO TABS: 800.0000 mg | ORAL_TABLET | Freq: Three times a day (TID) | ORAL | Status: DC
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
## 2013-04-16 NOTE — ED Notes (Signed)
Pt was pushing up a painted shut window and cut right inner forearm.  Small laceration.  Bleeding controlled

## 2013-04-18 NOTE — ED Provider Notes (Signed)
Medical screening examination/treatment/procedure(s) were performed by non-physician practitioner and as supervising physician I was immediately available for consultation/collaboration.  Juliet Rude. Rubin Payor, MD 04/18/13 [id number]

## 2013-09-03 ENCOUNTER — Encounter (HOSPITAL_COMMUNITY): Payer: Self-pay | Admitting: Emergency Medicine

## 2013-09-03 DIAGNOSIS — Y9389 Activity, other specified: Secondary | ICD-10-CM | POA: Insufficient documentation

## 2013-09-03 DIAGNOSIS — E119 Type 2 diabetes mellitus without complications: Secondary | ICD-10-CM | POA: Insufficient documentation

## 2013-09-03 DIAGNOSIS — Y92410 Unspecified street and highway as the place of occurrence of the external cause: Secondary | ICD-10-CM | POA: Insufficient documentation

## 2013-09-03 DIAGNOSIS — IMO0002 Reserved for concepts with insufficient information to code with codable children: Secondary | ICD-10-CM | POA: Insufficient documentation

## 2013-09-03 DIAGNOSIS — S0180XA Unspecified open wound of other part of head, initial encounter: Secondary | ICD-10-CM | POA: Insufficient documentation

## 2013-09-03 DIAGNOSIS — S8990XA Unspecified injury of unspecified lower leg, initial encounter: Secondary | ICD-10-CM | POA: Insufficient documentation
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
## 2013-09-04 ENCOUNTER — Emergency Department (HOSPITAL_COMMUNITY)
Admission: EM | Admit: 2013-09-04 | Discharge: 2013-09-04 | Discharge status: Against Medical Advice | Payer: No Typology Code available for payment source | Attending: Emergency Medicine | Admitting: Emergency Medicine

## 2013-09-04 ENCOUNTER — Encounter (HOSPITAL_COMMUNITY): Payer: Self-pay | Admitting: Emergency Medicine

## 2013-09-04 ENCOUNTER — Emergency Department (HOSPITAL_COMMUNITY): Payer: No Typology Code available for payment source

## 2013-09-04 ENCOUNTER — Emergency Department (HOSPITAL_COMMUNITY)
Admission: EM | Admit: 2013-09-04 | Discharge: 2013-09-04 | Disposition: A | Discharge status: Home / Self Care | Payer: No Typology Code available for payment source | Attending: Emergency Medicine | Admitting: Emergency Medicine

## 2013-09-04 DIAGNOSIS — S8990XA Unspecified injury of unspecified lower leg, initial encounter: Secondary | ICD-10-CM | POA: Insufficient documentation

## 2013-09-04 DIAGNOSIS — S20212A Contusion of left front wall of thorax, initial encounter: Secondary | ICD-10-CM

## 2013-09-04 DIAGNOSIS — S0993XA Unspecified injury of face, initial encounter: Secondary | ICD-10-CM | POA: Insufficient documentation

## 2013-09-04 DIAGNOSIS — E119 Type 2 diabetes mellitus without complications: Secondary | ICD-10-CM | POA: Insufficient documentation

## 2013-09-04 DIAGNOSIS — Z79899 Other long term (current) drug therapy: Secondary | ICD-10-CM | POA: Insufficient documentation

## 2013-09-04 DIAGNOSIS — M542 Cervicalgia: Secondary | ICD-10-CM

## 2013-09-04 DIAGNOSIS — M25562 Pain in left knee: Secondary | ICD-10-CM

## 2013-09-04 DIAGNOSIS — S0181XA Laceration without foreign body of other part of head, initial encounter: Secondary | ICD-10-CM

## 2013-09-04 DIAGNOSIS — S0180XA Unspecified open wound of other part of head, initial encounter: Secondary | ICD-10-CM | POA: Insufficient documentation

## 2013-09-04 DIAGNOSIS — Y92410 Unspecified street and highway as the place of occurrence of the external cause: Secondary | ICD-10-CM | POA: Insufficient documentation

## 2013-09-04 DIAGNOSIS — Y9389 Activity, other specified: Secondary | ICD-10-CM | POA: Insufficient documentation

## 2013-09-04 DIAGNOSIS — S20219A Contusion of unspecified front wall of thorax, initial encounter: Secondary | ICD-10-CM | POA: Insufficient documentation

## 2013-09-04 MED ORDER — ONDANSETRON HCL 4 MG/2ML IJ SOLN
4.0000 mg | Freq: Once | INTRAMUSCULAR | Status: AC
  Administered 2013-09-04: 4 mg via INTRAVENOUS
  Filled 2013-09-04: qty 2

## 2013-09-04 MED ORDER — NAPROXEN 500 MG PO TABS: 500.0000 mg | ORAL_TABLET | Freq: Once | ORAL | Status: DC

## 2013-09-04 MED ORDER — METHOCARBAMOL 500 MG PO TABS: 500.0000 mg | ORAL_TABLET | Freq: Two times a day (BID) | ORAL | Status: AC

## 2013-09-04 MED ORDER — NAPROXEN 500 MG PO TABS: 500.0000 mg | ORAL_TABLET | Freq: Two times a day (BID) | ORAL | Status: AC

## 2013-09-04 MED ORDER — HYDROCODONE-ACETAMINOPHEN 5-325 MG PO TABS: 1.0000 | ORAL_TABLET | Freq: Four times a day (QID) | ORAL | Status: AC | PRN

## 2013-09-04 MED ORDER — OXYCODONE-ACETAMINOPHEN 5-325 MG PO TABS: 1.0000 | ORAL_TABLET | Freq: Once | ORAL | Status: DC

## 2013-09-04 MED ORDER — MORPHINE SULFATE 4 MG/ML IJ SOLN
4.0000 mg | Freq: Once | INTRAMUSCULAR | Status: AC
  Administered 2013-09-04: 4 mg via INTRAVENOUS
  Filled 2013-09-04: qty 1

## 2013-09-04 MED ORDER — IOHEXOL 350 MG/ML SOLN
100.0000 mL | Freq: Once | INTRAVENOUS | Status: AC | PRN
  Administered 2013-09-04: 100 mL via INTRAVENOUS
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
## 2013-09-04 NOTE — ED Notes (Signed)
#  1 laceration to chin, #2 left breat bruised #3 left knee bruised  Pt c/o of hurting

## 2013-09-04 NOTE — ED Notes (Signed)
Transported to CT 

## 2013-09-04 NOTE — Progress Notes (Signed)
[facility] provided pt with a list of primary care resources and a [facility] [employer] application.  °
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
## 2013-09-05 NOTE — ED Provider Notes (Signed)
Medical screening examination/treatment/procedure(s) were conducted as a shared visit with non-physician practitioner(s) and myself.  I personally evaluated the patient during the encounter  Please see my separate respective documentation pertaining to this patient encounter   Vida Roller, MD 09/05/13 [phone]

## 2014-01-12 ENCOUNTER — Emergency Department (HOSPITAL_COMMUNITY)
Admission: EM | Admit: 2014-01-12 | Discharge: 2014-01-20 | Disposition: E | Payer: No Typology Code available for payment source | Attending: Emergency Medicine | Admitting: Emergency Medicine

## 2014-01-12 ENCOUNTER — Encounter (HOSPITAL_COMMUNITY): Payer: Self-pay | Admitting: Emergency Medicine

## 2014-01-12 DIAGNOSIS — I469 Cardiac arrest, cause unspecified: Secondary | ICD-10-CM | POA: Insufficient documentation

## 2014-01-12 DIAGNOSIS — Z791 Long term (current) use of non-steroidal anti-inflammatories (NSAID): Secondary | ICD-10-CM | POA: Insufficient documentation

## 2014-01-12 DIAGNOSIS — Z880 Allergy status to penicillin: Secondary | ICD-10-CM | POA: Insufficient documentation

## 2014-01-12 DIAGNOSIS — E669 Obesity, unspecified: Secondary | ICD-10-CM | POA: Insufficient documentation

## 2014-01-12 DIAGNOSIS — Z79899 Other long term (current) drug therapy: Secondary | ICD-10-CM | POA: Insufficient documentation

## 2014-01-12 DIAGNOSIS — E119 Type 2 diabetes mellitus without complications: Secondary | ICD-10-CM | POA: Insufficient documentation

## 2014-01-12 LAB — GLUCOSE, CAPILLARY: GLUCOSE-CAPILLARY: 246 mg/dL — AB (ref 70–99)

## 2014-01-12 MED ORDER — EPINEPHRINE HCL 0.1 MG/ML IJ SOSY: 1.0000 mg | PREFILLED_SYRINGE | Freq: Once | INTRAMUSCULAR | Status: DC

## 2014-01-12 MED ORDER — EPINEPHRINE HCL 0.1 MG/ML IJ SOSY
PREFILLED_SYRINGE | INTRAMUSCULAR | Status: AC | PRN
  Administered 2014-01-12: 1 mg via INTRAVENOUS

## 2014-01-12 MED ORDER — CALCIUM CHLORIDE 10 % IV SOLN
INTRAVENOUS | Status: AC | PRN
  Administered 2014-01-12: 1 g via INTRAVENOUS

## 2014-01-12 MED ORDER — SODIUM BICARBONATE 8.4 % IV SOLN
INTRAVENOUS | Status: AC | PRN
  Administered 2014-01-12: 50 meq via INTRAVENOUS

## 2014-01-15 MED FILL — Medication: Qty: 1 | Status: AC
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
## 2014-01-20 NOTE — ED Provider Notes (Signed)
CSN: [id number]     Arrival date & time 01/20/14  1237 History   First MD Initiated Contact with Patient 002/01/15 1238     Chief Complaint  Patient presents with  . Cardiac Arrest   (Consider location/radiation/quality/duration/timing/severity/associated sxs/prior Treatment) HPI Comments: Patient arrives by EMS with CPR in progress. Witnessed arrest in front of family. CPR started by family. Ongoing CPR for 40 minutes. EMS gave 6 rounds of epinephrine. Initial rhythm was PEA which progressed to asystole. Patient no spontaneous movements. Blood sugar greater than 200. Only known medical history is diabetes.  The history is provided by the patient and the EMS personnel. The history is limited by the condition of the patient.    Past Medical History  Diagnosis Date  . Diabetes mellitus    Past Surgical History  Procedure Laterality Date  . Hernia repair    . Cesarean section     Family History  Problem Relation Age of Onset  . Diabetes Mother   . Hypertension Mother   . Cancer Mother   . Diabetes Other   . Hypertension Other   . Cancer Other    History  Substance Use Topics  . Smoking status: Never Smoker   . Smokeless tobacco: Not on file  . Alcohol Use: Yes     Comment: occasional   OB History   Grav Para Term Preterm Abortions TAB SAB Ect Mult Living                 Review of Systems  Unable to perform ROS: Acuity of condition    Allergies  Penicillins  Home Medications   Current Outpatient Rx  Name  Route  Sig  Dispense  Refill  . acetaminophen (TYLENOL) 500 MG tablet   Oral   Take 1,000 mg by mouth every 6 (six) hours as needed for pain.         Marland Kitchen. HYDROcodone-acetaminophen (NORCO/VICODIN) 5-325 MG per tablet   Oral   Take 1-2 tablets by mouth every 6 (six) hours as needed for pain.   15 tablet   0   . ibuprofen (ADVIL,MOTRIN) 200 MG tablet   Oral   Take 600-800 mg by mouth every 6 (six) hours as needed for pain.          . metFORMIN  (GLUCOPHAGE) 850 MG tablet   Oral   Take 850 mg by mouth 2 (two) times daily with a meal.         . methocarbamol (ROBAXIN) 500 MG tablet   Oral   Take 1 tablet (500 mg total) by mouth 2 (two) times daily.   20 tablet   0   . naproxen (NAPROSYN) 500 MG tablet   Oral   Take 1 tablet (500 mg total) by mouth 2 (two) times daily.   30 tablet   0    Wt 235 lb (106.595 kg) Physical Exam  Constitutional: No distress.  Unresponsive and cyanotic  HENT:  Head: Normocephalic and atraumatic.  Pupils fixed and dilated  Cardiovascular:  No heart sounds heard  Pulmonary/Chest:  Equal breath sounds with bagging  Abdominal: Soft. There is no tenderness.  Obese  Musculoskeletal:  IO in place left tibia  Neurological:  Unresponsive, GCS 3  Skin:  Cold and mottled    ED Course  Procedures (including critical care time) Labs Review Labs Reviewed  GLUCOSE, CAPILLARY - Abnormal; Notable for the following:    Glucose-Capillary 246 (*)    All other components within normal  limits   Imaging Review No results found.  EKG Interpretation   None       MDM   1. Cardiac arrest    Witnessed arrest with CPR in progress. Ongoing CPR for 40 minutes. PEA degenerated to asystole en route. Patient received 6 rounds of epinephrine PTA.  CBG on arrival to 246. Epinephrine, bicarbonate and calcium given.  Patient's pupils are fixed and dilated. She's unresponsive and cyanotic.  She has had ongoing resuscitation for 40 minutes with no response. Asystole on the monitor. Ultrasound shows no cardiac activity. Time of death pronounced at 1244.  Case was discussed with patient's family. Case discussed with medical examiner Luanna Salk who accepts patient for autopsy. Patient does not have a PCP per family.  Cardiopulmonary Resuscitation (CPR) Procedure Note Directed/Performed by: Glynn Octave I personally directed ancillary staff and/or performed CPR in an effort to regain return of  spontaneous circulation and to maintain cardiac, neuro and systemic perfusion.      EMERGENCY DEPARTMENT [place] CARDIAC EXAM "Study: Limited Ultrasound of the heart and pericardium"  INDICATIONS:Cardiac arrest Multiple views of the heart and pericardium are obtained with a multi-frequency probe.  PERFORMED ZO:[id number]  IMAGES ARCHIVED?: No  FINDINGS: No pericardial effusion, Tamponade physiology absent and No cardiac activity  LIMITATIONS:  Body habitus and Emergent procedure  VIEWS USED: Subcostal 4 chamber  INTERPRETATION: Cardiac activity absent, Pericardial effusioin absent and Cardiac tamponade absent  COMMENT:      Glynn Octave, MD 01-17-2014 1530

## 2014-01-20 NOTE — Progress Notes (Signed)
Chaplain was paged to CPR death.  Chaplain promoted information sharing and hospitality to family of pt.  Chaplain gave emotional and grief support as well as helped family navigate next steps for arrangements.  Family was still waiting to visit pt when chaplain was called away for code blue in 2S.  Chaplain will follow up in the ED to see if they were able to visit the pt and have since left the hospital.

## 2014-01-20 DEATH — deceased
